# Patient Record
Sex: Male | Born: 1990 | Race: Black or African American | Hispanic: No | Marital: Married | Smoking: Never smoker
Health system: Southern US, Community
[De-identification: ages and names within clinical notes are randomized; demographics above are authoritative.]

## PROBLEM LIST (undated history)

## (undated) DIAGNOSIS — E785 Hyperlipidemia, unspecified: Secondary | ICD-10-CM

## (undated) HISTORY — DX: Hyperlipidemia, unspecified: E78.5

## (undated) HISTORY — PX: SHOULDER SURGERY: SHX246

---
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
## 2012-03-29 ENCOUNTER — Other Ambulatory Visit: Payer: Self-pay | Admitting: Orthopedic Surgery

## 2012-03-29 DIAGNOSIS — M25512 Pain in left shoulder: Secondary | ICD-10-CM

## 2012-04-02 ENCOUNTER — Ambulatory Visit
Admission: RE | Admit: 2012-04-02 | Discharge: 2012-04-02 | Disposition: A | Discharge status: Home / Self Care | Payer: PRIVATE HEALTH INSURANCE | Source: Ambulatory Visit | Attending: Orthopedic Surgery | Admitting: Orthopedic Surgery

## 2012-04-02 DIAGNOSIS — M25512 Pain in left shoulder: Secondary | ICD-10-CM

## 2012-07-05 ENCOUNTER — Emergency Department (HOSPITAL_COMMUNITY)
Admission: EM | Admit: 2012-07-05 | Discharge: 2012-07-05 | Disposition: A | Discharge status: Home / Self Care | Payer: 59 | Source: Home / Self Care | Attending: Emergency Medicine | Admitting: Emergency Medicine

## 2012-07-05 ENCOUNTER — Encounter (HOSPITAL_COMMUNITY): Payer: Self-pay | Admitting: Emergency Medicine

## 2012-07-05 DIAGNOSIS — J039 Acute tonsillitis, unspecified: Secondary | ICD-10-CM

## 2012-07-05 MED ORDER — PREDNISONE 5 MG PO KIT: 1.0000 | PACK | Freq: Every day | ORAL | Status: DC

## 2012-07-05 MED ORDER — PENICILLIN V POTASSIUM 500 MG PO TABS: 500.0000 mg | ORAL_TABLET | Freq: Four times a day (QID) | ORAL | Status: DC

## 2012-07-05 NOTE — ED Provider Notes (Signed)
Chief Complaint  Patient presents with  . Fever    History of Present Illness:   Zachary Hodges is an 21 year old male who has had a five-day history of fever of up to 103.7, chills, nasal congestion with clear rhinorrhea, headache, sore throat, abdominal pain. He has had no nausea or vomiting. He's had a slight cough productive of small amounts of yellow sputum, soreness in his neck, and feels lightheaded. He has not been exposed to strep, to the flu, or to mono and has had no personal history of strep or mono in the past.  Review of Systems:  Other than as noted above, the patient denies any of the following symptoms. Systemic:  No fever, chills, sweats, fatigue, myalgias, headache, or anorexia. Eye:  No redness, pain or drainage. ENT:  No earache, ear congestion, nasal congestion, sneezing, rhinorrhea, sinus pressure, sinus pain, or post nasal drip. Lungs:  No cough, sputum production, wheezing, shortness of breath, or chest pain. GI:  No abdominal pain, nausea, vomiting, or diarrhea. Skin:  No rash or itching.  PMFSH:  Past medical history, family history, social history, meds, allergies, and nurse's notes were reviewed.  There is no known exposure to strep or mono.  No prior history of step or mono.  The patient denies use of tobacco.  Physical Exam:   Vital signs:  BP 118/78  Pulse 97  Temp 100.9 F (38.3 C) (Oral)  Resp 18  SpO2 96% General:  Alert, in no distress. Eye:  No conjunctival injection or drainage. Lids were normal. ENT:  TMs and canals were normal, without erythema or inflammation.  Nasal mucosa was clear and uncongested, without drainage.  Mucous membranes were moist.  Exam of pharynx reveals tonsils to be enlarged, red, and with spots of white exudate.  There were no oral ulcerations or lesions. Neck:  Supple, with bilateral, tender anterior cervical adenopathy. Lungs:  No respiratory distress.  Lungs were clear to auscultation, without wheezes, rales or rhonchi.  Breath  sounds were clear and equal bilaterally.  Heart:  Regular rhythm, without gallops, murmers or rubs. Skin:  Clear, warm, and dry, without rash or lesions.  Labs:   Results for orders placed during the hospital encounter of 07/05/12  POCT RAPID STREP A ([facility] ONLY)      Component Value Range   Streptococcus, Group A Screen (Direct) NEGATIVE  NEGATIVE  POCT INFECTIOUS MONO SCREEN      Component Value Range   Mono Screen NEGATIVE  NEGATIVE    Assessment:  The encounter diagnosis was Tonsillitis.  Plan:   1.  The following meds were prescribed:   New Prescriptions   PENICILLIN V POTASSIUM (VEETID) 500 MG TABLET    Take 1 tablet (500 mg total) by mouth 4 (four) times daily.   PREDNISONE 5 MG KIT    Take 1 kit (5 mg total) by mouth daily after breakfast. Prednisone 5 mg 6 day dosepack.  Take as directed.   2.  The patient was instructed in symptomatic care including hot saline gargles, throat lozenges, infectious precautions, and need to trade out toothbrush. Handouts were given. 3.  The patient was told to return if becoming worse in any way, if no better in 3 or 4 days, and given some red flag symptoms that would indicate earlier return.    Reuben Likes, MD 07/05/12 2055

## 2012-07-05 NOTE — ED Notes (Signed)
C/o 1 week duration of fever, ST, body aches; NAD at present

## 2013-04-28 ENCOUNTER — Encounter (HOSPITAL_COMMUNITY): Payer: Self-pay | Admitting: Emergency Medicine

## 2013-04-28 ENCOUNTER — Emergency Department (INDEPENDENT_AMBULATORY_CARE_PROVIDER_SITE_OTHER)
Admission: EM | Admit: 2013-04-28 | Discharge: 2013-04-28 | Disposition: A | Discharge status: Home / Self Care | Payer: 59 | Source: Home / Self Care | Attending: Emergency Medicine | Admitting: Emergency Medicine

## 2013-04-28 DIAGNOSIS — A630 Anogenital (venereal) warts: Secondary | ICD-10-CM

## 2013-04-28 MED ORDER — RANITIDINE HCL 150 MG PO CAPS: 150.0000 mg | ORAL_CAPSULE | Freq: Every day | ORAL | Status: DC

## 2013-04-28 MED ORDER — PODOFILOX 0.5 % EX SOLN: Freq: Two times a day (BID) | CUTANEOUS | Status: DC

## 2013-04-28 NOTE — ED Provider Notes (Signed)
CSN: [id number]     Arrival date & time 04/28/13  1011 History     First MD Initiated Contact with Patient 04/28/13 1022     Chief Complaint  Patient presents with  . Migraine  . Groin Swelling   (Consider location/radiation/quality/duration/timing/severity/associated sxs/prior Treatment) HPI Comments: 22 year old male presents for evaluation of bumps on his penis for 4 months. He also has these in the area of his pubic hair. He denies any itching, pain, or discharge associated with these. He denies any contact with anybody known to have similar lesions. He has not tried to do anything to treat it is. He does not have it anywhere else on his body.  Additionally, he complains of moderately severe headache in both temples for the past week. This occurs every afternoon around 4:00 and goes away with Advil migraine. This has happened every day for the past 7 days it only lasts until he takes the Advil. He is not experiencing any headache right now. He denies any and all symptoms associated with this headache, both now and when the headache occurs. These include no dizziness, blurry vision, nausea, vomiting, skin rash, fever, chills. He states he has a history of migraines, he describes this has bad headaches that he assumes are migraines, he has never been diagnosed with migraine headaches. He admits these headaches are probably related to being dehydrated, he admits to drinking a lot of soda and no water at his job.   History reviewed. No pertinent past medical history. Past Surgical History  Procedure Laterality Date  . Shoulder surgery     History reviewed. No pertinent family history. History  Substance Use Topics  . Smoking status: Never Smoker   . Smokeless tobacco: Not on file  . Alcohol Use: Yes    Review of Systems  Constitutional: Negative for fever, chills and fatigue.  HENT: Negative for sore throat, neck pain and neck stiffness.   Eyes: Negative for visual disturbance.   Respiratory: Negative for cough and shortness of breath.   Cardiovascular: Negative for chest pain, palpitations and leg swelling.  Gastrointestinal: Negative for nausea, vomiting, abdominal pain, diarrhea and constipation.  Genitourinary: Negative for dysuria, urgency, frequency and hematuria.       Genital lesions   Musculoskeletal: Negative for myalgias and arthralgias.  Skin: Negative for rash.  Neurological: Positive for headaches. Negative for dizziness, weakness and light-headedness.    Allergies  Review of patient's allergies indicates no known allergies.  Home Medications   Current Outpatient Rx  Name  Route  Sig  Dispense  Refill  . penicillin v potassium (VEETID) 500 MG tablet   Oral   Take 1 tablet (500 mg total) by mouth 4 (four) times daily.   40 tablet   0   . podofilox (CONDYLOX) 0.5 % external solution   Topical   Apply topically 2 (two) times daily. with a cue tip or other cotton-tipped applicator for 3 days. Then no treatment for 4 days.  Repeat up to 4 times as needed   3.5 mL   1   . PredniSONE 5 MG KIT   Oral   Take 1 kit (5 mg total) by mouth daily after breakfast. Prednisone 5 mg 6 day dosepack.  Take as directed.   1 kit   0   . ranitidine (ZANTAC) 150 MG capsule   Oral   Take 1 capsule (150 mg total) by mouth daily.   30 capsule   0    BP 128/80  Pulse 61  Temp(Src) 98.2 F (36.8 C) (Oral)  Resp 16  SpO2 100% Physical Exam  Nursing note reviewed. Constitutional: He is oriented to person, place, and time. He appears well-developed and well-nourished. No distress.  HENT:  Head: Normocephalic and atraumatic.  Eyes: EOM are normal. Pupils are equal, round, and reactive to light.  Abdominal: Soft. There is no tenderness.  Genitourinary:  Discrete umbilicated papules on the shaft of the penis and in the mons pubis  Neurological: He is alert and oriented to person, place, and time. No cranial nerve deficit. Coordination normal.  Skin:  Skin is warm and dry. No rash noted.  Psychiatric: He has a normal mood and affect. Judgment normal.    ED Course   Procedures (including critical care time)  Labs Reviewed - No data to display No results found. 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 He is probably related to dehydration. Increase by mouth fluid intake and follow up with primary care if the headache does not resolve  Treat the condyloma with Condylox and oral Zantac. Follow up with derm if not improving.   Meds ordered this encounter  Medications  . podofilox (CONDYLOX) 0.5 % external solution    Sig: Apply topically 2 (two) times daily. with a cue tip or other cotton-tipped applicator for 3 days. Then no treatment for 4 days.  Repeat up to 4 times as needed    Dispense:  3.5 mL    Refill:  1  . ranitidine (ZANTAC) 150 MG capsule    Sig: Take 1 capsule (150 mg total) by mouth daily.    Dispense:  30 capsule    Refill:  0     Graylon Good, PA-C 04/28/13 1123   Graylon Good, PA-C 04/28/13 1124

## 2013-04-28 NOTE — ED Notes (Signed)
Pt c/o migraine headache for several weeks. Pt has been taking advil with no relief. Pt also voices concerns of bumps that he noticed on his genitals over 4 months ago that have not gone away. Pt has not used any otc meds for treatment.

## 2013-04-28 NOTE — ED Provider Notes (Signed)
Medical screening examination/treatment/procedure(s) were performed by non-physician practitioner and as supervising physician I was immediately available for consultation/collaboration.  Leslee Home, M.D.   Reuben Likes, MD 04/28/13 [phone]

## 2013-12-17 ENCOUNTER — Other Ambulatory Visit: Payer: Self-pay | Admitting: Physician Assistant

## 2013-12-17 ENCOUNTER — Ambulatory Visit
Admission: RE | Admit: 2013-12-17 | Discharge: 2013-12-17 | Disposition: A | Discharge status: Home / Self Care | Payer: 59 | Source: Ambulatory Visit | Attending: Physician Assistant | Admitting: Physician Assistant

## 2014-06-10 ENCOUNTER — Emergency Department (INDEPENDENT_AMBULATORY_CARE_PROVIDER_SITE_OTHER)
Admission: EM | Admit: 2014-06-10 | Discharge: 2014-06-10 | Disposition: A | Discharge status: Home / Self Care | Payer: 59 | Source: Home / Self Care | Attending: Family Medicine | Admitting: Family Medicine

## 2014-06-10 ENCOUNTER — Other Ambulatory Visit (HOSPITAL_COMMUNITY)
Admission: RE | Admit: 2014-06-10 | Discharge: 2014-06-10 | Disposition: A | Discharge status: Home / Self Care | Payer: 59 | Source: Ambulatory Visit | Attending: Family Medicine | Admitting: Family Medicine

## 2014-06-10 ENCOUNTER — Encounter (HOSPITAL_COMMUNITY): Payer: Self-pay | Admitting: Emergency Medicine

## 2014-06-10 DIAGNOSIS — Z113 Encounter for screening for infections with a predominantly sexual mode of transmission: Secondary | ICD-10-CM | POA: Insufficient documentation

## 2014-06-10 DIAGNOSIS — Z202 Contact with and (suspected) exposure to infections with a predominantly sexual mode of transmission: Secondary | ICD-10-CM

## 2014-06-10 NOTE — ED Notes (Signed)
Would like to be screened for STD Asymptomatic Sign. Other wants him to be screened since he had another sexual partner simultaneous   Alert, no signs of acute distress.

## 2014-06-10 NOTE — Discharge Instructions (Signed)
Thank you for coming in today. I will call if positive   Safe Sex Safe sex is about reducing the risk of giving or getting a sexually transmitted disease (STD). STDs are spread through sexual contact involving the genitals, mouth, or rectum. Some STDs can be cured and others cannot. Safe sex can also prevent unintended pregnancies.  WHAT ARE SOME SAFE SEX PRACTICES?  Limit your sexual activity to only one partner who is having sex with only you.  Talk to your partner about his or her past partners, past STDs, and drug use.  Use a condom every time you have sexual intercourse. This includes vaginal, oral, and anal sexual activity. Both females and males should wear condoms during oral sex. Only use latex or polyurethane condoms and water-based lubricants. Using petroleum-based lubricants or oils to lubricate a condom will weaken the condom and increase the chance that it will break. The condom should be in place from the beginning to the end of sexual activity. Wearing a condom reduces, but does not completely eliminate, your risk of getting or giving an STD. STDs can be spread by contact with infected body fluids and skin.  Get vaccinated for hepatitis B and HPV.  Avoid alcohol and recreational drugs, which can affect your judgment. You may forget to use a condom or participate in high-risk sex.  For females, avoid douching after sexual intercourse. Douching can spread an infection farther into the reproductive tract.  Check your body for signs of sores, blisters, rashes, or unusual discharge. See your health care provider if you notice any of these signs.  Avoid sexual contact if you have symptoms of an infection or are being treated for an STD. If you or your partner has herpes, avoid sexual contact when blisters are present. Use condoms at all other times.  If you are at risk of being infected with HIV, it is recommended that you take a prescription medicine daily to prevent HIV infection.  This is called pre-exposure prophylaxis (PrEP). You are considered at risk if:  You are a man who has sex with other men (MSM).  You are a heterosexual man or woman who is sexually active with more than one partner.  You take drugs by injection.  You are sexually active with a partner who has HIV.  Talk with your health care provider about whether you are at high risk of being infected with HIV. If you choose to begin PrEP, you should first be tested for HIV. You should then be tested every 3 months for as long as you are taking PrEP.  See your health care provider for regular screenings, exams, and tests for other STDs. Before having sex with a new partner, each of you should be screened for STDs and should talk about the results with each other. WHAT ARE THE BENEFITS OF SAFE SEX?   There is less chance of getting or giving an STD.  You can prevent unwanted or unintended pregnancies.  By discussing safe sex concerns with your partner, you may increase feelings of intimacy, comfort, trust, and honesty between the two of you. Document Released: 10/19/2004 Document Revised: 01/26/2014 Document Reviewed: 03/04/2012 [facility] Patient Information 2015 [place], [place]. This information is not intended to replace advice given to you by your health care provider. Make sure you discuss any questions you have with your health care provider.

## 2014-06-10 NOTE — ED Provider Notes (Signed)
Zachary Hodges is a 23 y.o. male who presents to Urgent Care today for STDs check. Patient is here today for STD testing. He is asymptomatic. He feels well otherwise.   History reviewed. No pertinent past medical history. History  Substance Use Topics  . Smoking status: Never Smoker   . Smokeless tobacco: Not on file  . Alcohol Use: Yes   ROS as above Medications: No current facility-administered medications for this encounter.   Current Outpatient Prescriptions  Medication Sig Dispense Refill  . penicillin v potassium (VEETID) 500 MG tablet Take 1 tablet (500 mg total) by mouth 4 (four) times daily.  40 tablet  0  . podofilox (CONDYLOX) 0.5 % external solution Apply topically 2 (two) times daily. with a cue tip or other cotton-tipped applicator for 3 days. Then no treatment for 4 days.  Repeat up to 4 times as needed  3.5 mL  1  . PredniSONE 5 MG KIT Take 1 kit (5 mg total) by mouth daily after breakfast. Prednisone 5 mg 6 day dosepack.  Take as directed.  1 kit  0  . ranitidine (ZANTAC) 150 MG capsule Take 1 capsule (150 mg total) by mouth daily.  30 capsule  0    Exam:  BP 128/86  Pulse 88  Temp(Src) 98.5 F (36.9 C) (Oral)  Resp 12  SpO2 98% Gen: Well NAD Genitals: No inguinal lymphadenopathy Testicles are descended bilaterally and nontender with no masses Penis is normal appearing circumcised without lesions or discharge  No results found for this or any previous visit (from the past 24 hour(s)). No results found.  Assessment and Plan: 23 y.o. male with STD check. Urine cytology for gonorrhea Chlamydia and Trichomonas, serology for HIV and syphilis are pending.  Discussed warning signs or symptoms. Please see discharge instructions. Patient expresses understanding.   This note was created using Systems analyst. Any transcription errors are unintended.    Gregor Hams, MD 06/10/14 [phone]

## 2014-06-10 NOTE — ED Notes (Signed)
Call back number verified.  

## 2014-06-11 LAB — RPR

## 2014-06-11 LAB — HIV ANTIBODY (ROUTINE TESTING W REFLEX): HIV: NONREACTIVE

## 2015-11-02 ENCOUNTER — Encounter (HOSPITAL_COMMUNITY): Payer: Self-pay | Admitting: Emergency Medicine

## 2015-11-02 ENCOUNTER — Emergency Department (HOSPITAL_COMMUNITY)
Admission: EM | Admit: 2015-11-02 | Discharge: 2015-11-02 | Disposition: A | Discharge status: Home / Self Care | Payer: 59 | Attending: Emergency Medicine | Admitting: Emergency Medicine

## 2015-11-02 DIAGNOSIS — J3489 Other specified disorders of nose and nasal sinuses: Secondary | ICD-10-CM | POA: Insufficient documentation

## 2015-11-02 DIAGNOSIS — R059 Cough, unspecified: Secondary | ICD-10-CM

## 2015-11-02 DIAGNOSIS — R05 Cough: Secondary | ICD-10-CM | POA: Insufficient documentation

## 2015-11-02 DIAGNOSIS — R0981 Nasal congestion: Secondary | ICD-10-CM | POA: Insufficient documentation

## 2015-11-02 DIAGNOSIS — J029 Acute pharyngitis, unspecified: Secondary | ICD-10-CM | POA: Diagnosis not present

## 2015-11-02 DIAGNOSIS — Z7952 Long term (current) use of systemic steroids: Secondary | ICD-10-CM | POA: Insufficient documentation

## 2015-11-02 DIAGNOSIS — R0789 Other chest pain: Secondary | ICD-10-CM | POA: Insufficient documentation

## 2015-11-02 DIAGNOSIS — Z79899 Other long term (current) drug therapy: Secondary | ICD-10-CM | POA: Diagnosis not present

## 2015-11-02 DIAGNOSIS — Z792 Long term (current) use of antibiotics: Secondary | ICD-10-CM | POA: Insufficient documentation

## 2015-11-02 DIAGNOSIS — R509 Fever, unspecified: Secondary | ICD-10-CM | POA: Insufficient documentation

## 2015-11-02 DIAGNOSIS — R0602 Shortness of breath: Secondary | ICD-10-CM | POA: Diagnosis not present

## 2015-11-02 DIAGNOSIS — M791 Myalgia: Secondary | ICD-10-CM | POA: Insufficient documentation

## 2015-11-02 MED ORDER — OXYMETAZOLINE HCL 0.05 % NA SOLN: 1.0000 | Freq: Two times a day (BID) | NASAL | Status: DC

## 2015-11-02 MED ORDER — BENZONATATE 100 MG PO CAPS: 200.0000 mg | ORAL_CAPSULE | Freq: Two times a day (BID) | ORAL | Status: DC | PRN

## 2015-11-02 MED ORDER — ACETAMINOPHEN 325 MG PO TABS
ORAL_TABLET | ORAL | Status: AC
  Filled 2015-11-02: qty 2

## 2015-11-02 MED ORDER — ACETAMINOPHEN 325 MG PO TABS
650.0000 mg | ORAL_TABLET | Freq: Once | ORAL | Status: AC | PRN
  Administered 2015-11-02: 650 mg via ORAL

## 2015-11-02 MED ORDER — OSELTAMIVIR PHOSPHATE 75 MG PO CAPS: 75.0000 mg | ORAL_CAPSULE | Freq: Two times a day (BID) | ORAL | Status: DC

## 2015-11-02 NOTE — ED Notes (Signed)
Pt sts nasal congestion, body aches and fever starting last night

## 2015-11-02 NOTE — ED Provider Notes (Signed)
CSN: [id number]     Arrival date & time 11/02/15  1233 History  By signing my name below, I, Eustaquio Maize, attest that this documentation has been prepared under the direction and in the presence of Harlene Ramus, PA-C. Electronically Signed: Eustaquio Maize, ED Scribe. 11/02/2015. 1:51 PM.   Chief Complaint  Patient presents with  . Nasal Congestion  . Chills   The history is provided by the patient. No language interpreter was used.     HPI Comments: Zachary Hodges is a 25 y.o. male who presents to the Emergency Department complaining of gradual onset, intermittent, nasal congestion and chills that began last night. Pt also complains of a subjective fever, dry cough, chest wall pain with coughing, mild shortness of breath, sinus pressure, myalgias, and sore throat. He took Tylenol and Benadryl last night which improved his symptoms but states they all came back this morning, prompting him to come to the ED. He has not taken any OTC decongestants or other medications. Recent sick contact with daughter. Denies ear pain, hemoptysis, wheezing, abdominal pain, nausea, vomiting, diarrhea, rash, or any other associated symptoms. Pt did not receive flu vaccine this year.    History reviewed. No pertinent past medical history. Past Surgical History  Procedure Laterality Date  . Shoulder surgery     History reviewed. No pertinent family history. Social History  Substance Use Topics  . Smoking status: Never Smoker   . Smokeless tobacco: None  . Alcohol Use: Yes    Review of Systems  Constitutional: Positive for fever (Subjective) and chills.  HENT: Positive for congestion, sinus pressure and sore throat. Negative for ear pain.   Respiratory: Positive for cough and shortness of breath. Negative for wheezing.   Cardiovascular: Positive for chest pain.  Gastrointestinal: Negative for nausea, vomiting, abdominal pain and diarrhea.  Musculoskeletal: Positive for myalgias.  Skin: Negative for  rash.   Allergies  Review of patient's allergies indicates no known allergies.  Home Medications   Prior to Admission medications   Medication Sig Start Date End Date Taking? Authorizing Provider  benzonatate (TESSALON) 100 MG capsule Take 2 capsules (200 mg total) by mouth 2 (two) times daily as needed for cough. 11/02/15   Nona Dell, PA-C  oseltamivir (TAMIFLU) 75 MG capsule Take 1 capsule (75 mg total) by mouth every 12 (twelve) hours. 11/02/15   Nona Dell, PA-C  oxymetazoline (AFRIN NASAL SPRAY) 0.05 % nasal spray Place 1 spray into both nostrils 2 (two) times daily. 11/02/15   Nona Dell, PA-C  penicillin v potassium (VEETID) 500 MG tablet Take 1 tablet (500 mg total) by mouth 4 (four) times daily. 07/05/12   Harden Mo, MD  podofilox (CONDYLOX) 0.5 % external solution Apply topically 2 (two) times daily. with a cue tip or other cotton-tipped applicator for 3 days. Then no treatment for 4 days.  Repeat up to 4 times as needed 04/28/13   Liam Graham, PA-C  PredniSONE 5 MG KIT Take 1 kit (5 mg total) by mouth daily after breakfast. Prednisone 5 mg 6 day dosepack.  Take as directed. 07/05/12   Harden Mo, MD  ranitidine (ZANTAC) 150 MG capsule Take 1 capsule (150 mg total) by mouth daily. 04/28/13   Freeman Caldron Baker, PA-C   BP 133/76 mmHg  Pulse 101  Temp(Src) 99.8 F (37.7 C) (Oral)  Resp 16  SpO2 100%   Physical Exam  Constitutional: He is oriented to person, place, and time. He appears  well-developed and well-nourished. No distress.  HENT:  Head: Normocephalic and atraumatic.  Right Ear: Tympanic membrane normal.  Left Ear: Tympanic membrane normal.  Nose: Rhinorrhea present. Right sinus exhibits no maxillary sinus tenderness and no frontal sinus tenderness. Left sinus exhibits no maxillary sinus tenderness and no frontal sinus tenderness.  Mouth/Throat: Uvula is midline and mucous membranes are normal. Posterior oropharyngeal erythema  present. No oropharyngeal exudate, posterior oropharyngeal edema or tonsillar abscesses.  Eyes: Conjunctivae and EOM are normal. Right eye exhibits no discharge. Left eye exhibits no discharge. No scleral icterus.  Neck: Normal range of motion. Neck supple. No tracheal deviation present.  No cervical lymphadenopathy  Cardiovascular: Normal rate, regular rhythm, normal heart sounds and intact distal pulses.   Heart rate 96 bpm  Pulmonary/Chest: Effort normal and breath sounds normal. No respiratory distress. He has no wheezes. He has no rales. He exhibits no tenderness.  Abdominal: Soft. There is no tenderness.  Musculoskeletal: Normal range of motion. He exhibits no edema.  Lymphadenopathy:    He has no cervical adenopathy.  Neurological: He is alert and oriented to person, place, and time.  Skin: Skin is warm and dry.  Psychiatric: He has a normal mood and affect. His behavior is normal.  Nursing note and vitals reviewed.   ED Course  Procedures (including critical care time)  DIAGNOSTIC STUDIES: Oxygen Saturation is 100% on RA, normal by my interpretation.    COORDINATION OF CARE: 1:45 PM-Discussed treatment plan with pt at bedside and pt agreed to plan.   Labs Review Labs Reviewed - No data to display  Imaging Review No results found.   Filed Vitals:   11/02/15 1239 11/02/15 1359  BP: 146/93 133/76  Pulse: 100 101  Temp: 100 F (37.8 C) 99.8 F (37.7 C)  Resp: 18 16     MDM   Final diagnoses:  Cough  Fever, unspecified fever cause  Nasal congestion   Patient with symptoms consistent with influenza vs. Viral URI.  Vitals are stable, initial temp in ED 100.  No signs of dehydration, tolerating PO's.  Lungs are clear. Due to patient's presentation and physical exam a chest x-ray was not ordered bc likely diagnosis of flu vs. Viral URI.  Patient will be discharged with instructions to orally hydrate, rest, and use over-the-counter medications such as  anti-inflammatories ibuprofen and Aleve for muscle aches and Tylenol for fever. Patient will also be given a cough suppressant, decongestant and Tamiflu due to sxs being present for <48 hours. Denies patient to follow up with his primary care provider in 3-4 days.  Evaluation does not show pathology requring ongoing emergent intervention or admission. Pt is hemodynamically stable and mentating appropriately. Discussed findings/results and plan with patient/guardian, who agrees with plan. All questions answered. Return precautions discussed and outpatient follow up given.    I personally performed the services described in this documentation, which was scribed in my presence. The recorded information has been reviewed and is accurate.   Chesley Noon [place], [place] 11/02/15 [place], MD 11/02/15 [phone]

## 2015-11-02 NOTE — Discharge Instructions (Signed)
Take your medications as prescribed. I also recommend using a cool mist humidifier at home for symptomatic relief. Continue drinking fluids to remain hydrated at home. Today taking Tylenol as prescribed over-the-counter for your fever and body aches. Follow-up with your primary care provider in 3-4 days. Return to the emergency department if symptoms worsen or new onset of uncontrollable fever, difficulty breathing, wheezing, chest pain, abdominal pain, vomiting, coughing up blood, rash.

## 2016-11-30 DIAGNOSIS — R070 Pain in throat: Secondary | ICD-10-CM | POA: Diagnosis not present

## 2017-01-12 ENCOUNTER — Encounter (HOSPITAL_COMMUNITY): Payer: Self-pay | Admitting: Oncology

## 2017-01-12 DIAGNOSIS — F439 Reaction to severe stress, unspecified: Secondary | ICD-10-CM | POA: Insufficient documentation

## 2017-01-12 DIAGNOSIS — R000 Tachycardia, unspecified: Secondary | ICD-10-CM | POA: Diagnosis not present

## 2017-01-12 DIAGNOSIS — Z79899 Other long term (current) drug therapy: Secondary | ICD-10-CM | POA: Insufficient documentation

## 2017-01-12 LAB — SALICYLATE LEVEL

## 2017-01-12 LAB — COMPREHENSIVE METABOLIC PANEL
ALT: 21 U/L (ref 17–63)
AST: 25 U/L (ref 15–41)
Albumin: 4.5 g/dL (ref 3.5–5.0)
Alkaline Phosphatase: 39 U/L (ref 38–126)
Anion gap: 8 (ref 5–15)
BUN: 18 mg/dL (ref 6–20)
CHLORIDE: 103 mmol/L (ref 101–111)
CO2: 28 mmol/L (ref 22–32)
CREATININE: 1.38 mg/dL — AB (ref 0.61–1.24)
Calcium: 9.5 mg/dL (ref 8.9–10.3)
GFR calc non Af Amer: >60 mL/min (ref >60)
Glucose, Bld: 113 mg/dL — ABNORMAL HIGH (ref 65–99)
POTASSIUM: 3.3 mmol/L — AB (ref 3.5–5.1)
SODIUM: 139 mmol/L (ref 135–145)
Total Bilirubin: 0.6 mg/dL (ref 0.3–1.2)
Total Protein: 7.4 g/dL (ref 6.5–8.1)

## 2017-01-12 LAB — CBC
HCT: 44.4 % (ref 39.0–52.0)
HEMOGLOBIN: 14.6 g/dL (ref 13.0–17.0)
MCH: 29.4 pg (ref 26.0–34.0)
MCHC: 32.9 g/dL (ref 30.0–36.0)
MCV: 89.3 fL (ref 78.0–100.0)
PLATELETS: 185 10*3/uL (ref 150–400)
RBC: 4.97 MIL/uL (ref 4.22–5.81)
RDW: 11.8 % (ref 11.5–15.5)
WBC: 5.5 10*3/uL (ref 4.0–10.5)

## 2017-01-12 LAB — ACETAMINOPHEN LEVEL: Acetaminophen (Tylenol), Serum: <10 ug/mL — ABNORMAL LOW (ref 10–30)

## 2017-01-12 LAB — ETHANOL: Alcohol, Ethyl (B): <5 mg/dL (ref <5)

## 2017-01-12 NOTE — ED Triage Notes (Signed)
Pt presents d/t left sided CP that does not radiate.  States pain is sharp and worse w/ inspiration.  Pt rates pain 5/10, sharp in nature.

## 2017-01-12 NOTE — ED Triage Notes (Signed)
Pt's wife asked to step out.  Pt states that he has been feeling depressed x 2 years.  Feels trapped in current relationship.  Pt states that he has passive SI and would like to be further evaluated.

## 2017-01-13 ENCOUNTER — Emergency Department (HOSPITAL_COMMUNITY)
Admission: EM | Admit: 2017-01-13 | Discharge: 2017-01-13 | Disposition: A | Discharge status: Home / Self Care | Payer: 59 | Attending: Emergency Medicine | Admitting: Emergency Medicine

## 2017-01-13 DIAGNOSIS — F439 Reaction to severe stress, unspecified: Secondary | ICD-10-CM

## 2017-01-13 MED ORDER — ACETAMINOPHEN 325 MG PO TABS
650.0000 mg | ORAL_TABLET | Freq: Once | ORAL | Status: DC
  Filled 2017-01-13: qty 2

## 2017-01-13 NOTE — ED Notes (Signed)
Pts Wife Zak Gondek [phone]. States she will come at 9 in the morning. Or if needed she can be called for questions or to be updated.

## 2017-01-13 NOTE — ED Provider Notes (Signed)
[facility]-EMERGENCY DEPT Provider Note   CSN: [id number] Arrival date & time: 01/12/17  2209     History   Chief Complaint Chief Complaint  Patient presents with  . Suicidal    HPI Zachary Hodges is a 26 y.o. male.  Patient is seen at 1:50 am. He came to the ER for evaluation of stress and feeling like he is having problems managing his life. He is in school full time, works, is married and has 2 children, the youngest of which is 19 month old. He denies substance abuse issues. He denies suicidal ideation, homicidal ideation or hallucinations. No history of SI/HI/AVH. He does not feel he is a threat to himself or his family.   The history is provided by the patient. No language interpreter was used.    History reviewed. No pertinent past medical history.  There are no active problems to display for this patient.   Past Surgical History:  Procedure Laterality Date  . SHOULDER SURGERY         Home Medications    Prior to Admission medications   Medication Sig Start Date End Date Taking? Authorizing Provider  ibuprofen (ADVIL,MOTRIN) 200 MG tablet Take 200 mg by mouth every 6 (six) hours as needed for headache, mild pain or moderate pain.   Yes Historical Provider, MD    Family History No family history on file.  Social History Social History  Substance Use Topics  . Smoking status: Never Smoker  . Smokeless tobacco: Never Used  . Alcohol use Yes     Allergies   Patient has no known allergies.   Review of Systems Review of Systems  Constitutional: Negative for chills and fever.  HENT: Negative.   Respiratory: Negative.   Cardiovascular: Negative.   Gastrointestinal: Negative.   Musculoskeletal: Negative.   Skin: Negative.   Neurological: Negative.   Psychiatric/Behavioral: Positive for dysphoric mood.     Physical Exam Updated Vital Signs BP 134/89 (BP Location: Right Arm)   Pulse 99   Temp 98.4 F (36.9 C) (Oral)   Resp 14   SpO2 100%    Physical Exam  Constitutional: He is oriented to person, place, and time. He appears well-developed and well-nourished.  Neck: Normal range of motion.  Pulmonary/Chest: Effort normal.  Musculoskeletal: Normal range of motion.  Neurological: He is alert and oriented to person, place, and time.  Skin: Skin is warm and dry.  Psychiatric: He has a normal mood and affect.     ED Treatments / Results  Labs (all labs ordered are listed, but only abnormal results are displayed) Labs Reviewed  COMPREHENSIVE METABOLIC PANEL - Abnormal; Notable for the following:       Result Value   Potassium 3.3 (*)    Glucose, Bld 113 (*)    Creatinine, Ser 1.38 (*)    All other components within normal limits  ACETAMINOPHEN LEVEL - Abnormal; Notable for the following:    Acetaminophen (Tylenol), Serum <10 (*)    All other components within normal limits  ETHANOL  SALICYLATE LEVEL  CBC  RAPID URINE DRUG SCREEN, HOSP PERFORMED    EKG  EKG Interpretation None       Radiology No results found.  Procedures Procedures (including critical care time)  Medications Ordered in ED Medications  acetaminophen (TYLENOL) tablet 650 mg (650 mg Oral Not Given 01/13/17 0050)     Initial Impression / Assessment and Plan / ED Course  I have reviewed the triage vital signs and the  nursing notes.  Pertinent labs & imaging results that were available during my care of the patient were reviewed by me and considered in my medical decision making (see chart for details).     Patient in ED for help with stress. He denies substance abuse issues. He denies suicidal ideation, homicidal ideation or hallucinations. No history of SI/HI/AVH. He does not feel he is a threat to himself or his family.  He is felt stable for discharge home as he is not felt to be at risk for self harm. Resources provided for outpatient counseling.  Final Clinical Impressions(s) / ED Diagnoses   Final diagnoses:  None   1.  Stress  New Prescriptions New Prescriptions   No medications on file     Danne Harbor 01/13/17 0158    April Palumbo, MD 01/13/17 [phone]

## 2017-01-13 NOTE — ED Notes (Signed)
SBAR Report received from previous nurse. Pt received calm and visible on unit. Pt denies current SI/ HI, A/V H, depression, anxiety, or pain at this time, and appears otherwise stable and free of distress. Pt reminded of camera surveillance, q 15 min rounds, and rules of the milieu. Will continue to assess. 

## 2017-01-13 NOTE — ED Notes (Signed)
Bed: [facility] Expected date:  Expected time:  Means of arrival:  Comments: [place]

## 2017-01-13 NOTE — ED Notes (Signed)
Orders received to discharge patient. Pt aware and discharge summery reviewed with patient as well as review of medications. All personal items return and patient signed all discharge paperwork. Pt escorted off unit.  

## 2017-01-13 NOTE — ED Notes (Signed)
EDp notified of need for tts consult and psych hold orders.

## 2017-03-16 DIAGNOSIS — Z0000 Encounter for general adult medical examination without abnormal findings: Secondary | ICD-10-CM | POA: Diagnosis not present

## 2017-03-16 DIAGNOSIS — Z23 Encounter for immunization: Secondary | ICD-10-CM | POA: Diagnosis not present

## 2017-03-16 DIAGNOSIS — Z13220 Encounter for screening for lipoid disorders: Secondary | ICD-10-CM | POA: Diagnosis not present

## 2020-07-09 ENCOUNTER — Encounter: Payer: Self-pay | Admitting: Internal Medicine

## 2020-07-09 ENCOUNTER — Ambulatory Visit (INDEPENDENT_AMBULATORY_CARE_PROVIDER_SITE_OTHER): Payer: 59 | Admitting: Internal Medicine

## 2020-07-09 ENCOUNTER — Other Ambulatory Visit: Payer: Self-pay

## 2020-07-09 VITALS — BP 155/90 | HR 106 | Temp 98.4°F | Ht 68.5 in | Wt 191.0 lb

## 2020-07-09 DIAGNOSIS — E785 Hyperlipidemia, unspecified: Secondary | ICD-10-CM | POA: Diagnosis not present

## 2020-07-09 DIAGNOSIS — Z23 Encounter for immunization: Secondary | ICD-10-CM

## 2020-07-09 DIAGNOSIS — R030 Elevated blood-pressure reading, without diagnosis of hypertension: Secondary | ICD-10-CM

## 2020-07-09 NOTE — Patient Instructions (Signed)
-  Nice seeing you today!!  -tetanus vaccine today.  -Check your blood pressure 2-3 a week at home and bring measurements into your next visit.  -Schedule follow up in 3 months for your physical. Please come in fasting that day.

## 2020-07-09 NOTE — Progress Notes (Signed)
New Patient Office Visit     This visit occurred during the SARS-CoV-2 public health emergency.  Safety protocols were in place, including screening questions prior to the visit, additional usage of staff PPE, and extensive cleaning of exam room while observing appropriate contact time as indicated for disinfecting solutions.    CC/Reason for Visit: Establish care, discuss chronic conditions Previous PCP: Johny Blamer, MD Last Visit: 2019  HPI: Zachary Hodges is a 29 y.o. male who is coming in today for the above mentioned reasons. Past Medical History is significant for: Hypertension that has not been treated.  He has been told throughout the years that he has high blood pressure but has never been diagnosed as hypertensive.  He has no acute complaints today.  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  His family history significant for a paternal grandfather with coronary artery disease.  He is overdue for flu, COVID, Tdap vaccines.   Past Medical/Surgical History: Past Medical History:  Diagnosis Date  . Hyperlipidemia     Past Surgical History:  Procedure Laterality Date  . SHOULDER SURGERY      Social History:  reports that he has never smoked. He has never used smokeless tobacco. He reports current alcohol use. He reports that he does not use drugs.  Allergies: No Known Allergies  Family History:  Family History  Problem Relation Age of Onset  . CAD Paternal Grandfather      Current Outpatient Medications:  .  ibuprofen (ADVIL,MOTRIN) 200 MG tablet, Take 200 mg by mouth every 6 (six) hours as needed for headache, mild pain or moderate pain. (Patient not taking: Reported on 07/09/2020), Disp: , Rfl:   Review of Systems:  Constitutional: Denies fever, chills,  diaphoresis, appetite change and fatigue.  HEENT: Denies photophobia, eye pain, redness, hearing loss, ear pain, congestion, sore throat, rhinorrhea, sneezing, mouth sores, trouble swallowing, neck pain, neck stiffness and tinnitus.   Respiratory: Denies SOB, DOE, cough, chest tightness,  and wheezing.   Cardiovascular: Denies chest pain, palpitations and leg swelling.  Gastrointestinal: Denies nausea, vomiting, abdominal pain, diarrhea, constipation, blood in stool and abdominal distention.  Genitourinary: Denies dysuria, urgency, frequency, hematuria, flank pain and difficulty urinating.  Endocrine: Denies: hot or cold intolerance, sweats, changes in hair or nails, polyuria, polydipsia. Musculoskeletal: Denies myalgias, back pain, joint swelling, arthralgias and gait problem.  Skin: Denies pallor, rash and wound.  Neurological: Denies dizziness, seizures, syncope, weakness, light-headedness, numbness and headaches.  Hematological: Denies adenopathy. Easy bruising, personal or family bleeding history  Psychiatric/Behavioral: Denies suicidal ideation, mood changes, confusion, nervousness, sleep disturbance and agitation    Physical Exam: Vitals:   07/09/20 1411  BP: (!) 160/90  Pulse: (!) 106  Temp: 98.4 F (36.9 C)  TempSrc: Oral  SpO2: 96%  Weight: 191 lb (86.6 kg)  Height: 5' 8.5" (1.74 m)   Body mass index is 28.62 kg/m.  Constitutional: NAD, calm, comfortable Eyes: PERRL, lids and conjunctivae normal ENMT: Mucous membranes are moist.  Respiratory: clear to auscultation bilaterally, no wheezing, no crackles. Normal respiratory effort. No accessory muscle use.  Cardiovascular: Regular rate and rhythm, no murmurs / rubs / gallops. No extremity edema. 2+ pedal pulses. No carotid bruits.  Neurologic: Grossly intact and nonfocal Psychiatric: Normal judgment and insight. Alert  and oriented x 3. Normal mood.    Impression and Plan:  Elevated BP without diagnosis of  hypertension -2 measurements in office today are elevated, he has never been diagnosed with hypertension but has been noted to have elevated measurements in the past. -He will do ambulatory blood pressure monitoring and return in 3 months for follow-up.  Hyperlipidemia, unspecified hyperlipidemia type -Last measured LDL was 168 in 2019. -He will return in 3 months fasting at which time we will recheck his lipid panel.  Need for Tdap vaccination -Tdap administered today.  He declines flu and Covid vaccines today.    Patient Instructions  -Nice seeing you today!!  -tetanus vaccine today.  -Check your blood pressure 2-3 a week at home and bring measurements into your next visit.  -Schedule follow up in 3 months for your physical. Please come in fasting that day.     Chaya Jan, MD [facility]

## 2020-07-09 NOTE — Addendum Note (Signed)
Addended by: Radford Pax M on: 07/09/2020 02:47 PM   Modules accepted: Orders

## 2020-09-10 ENCOUNTER — Other Ambulatory Visit: Payer: 59

## 2020-09-10 DIAGNOSIS — Z20822 Contact with and (suspected) exposure to covid-19: Secondary | ICD-10-CM

## 2020-09-11 LAB — SARS-COV-2, NAA 2 DAY TAT

## 2020-09-11 LAB — NOVEL CORONAVIRUS, NAA: SARS-CoV-2, NAA: NOT DETECTED

## 2020-09-14 ENCOUNTER — Telehealth (INDEPENDENT_AMBULATORY_CARE_PROVIDER_SITE_OTHER): Payer: 59 | Admitting: Family Medicine

## 2020-09-14 DIAGNOSIS — U071 COVID-19: Secondary | ICD-10-CM

## 2020-09-14 MED ORDER — BENZONATATE 100 MG PO CAPS
100.0000 mg | ORAL_CAPSULE | Freq: Three times a day (TID) | ORAL | 0 refills | Status: DC | PRN
Start: 2020-09-14 — End: 2021-01-11

## 2020-09-14 NOTE — Patient Instructions (Addendum)
  HOME CARE TIPS:   -I sent the medication(s) we discussed to your pharmacy: Meds ordered this encounter  Medications  . benzonatate (TESSALON PERLES) 100 MG capsule    Sig: Take 1 capsule (100 mg total) by mouth 3 (three) times daily as needed.    Dispense:  20 capsule    Refill:  0     -COVID19 outpatient treatment center: [phone] (only call if your Covid test is positive and you are interested in monoclonal antibody treatment which is available to those with risk factors within 10 days of symptom onset)  -can use tylenol or aleve if needed for fevers, aches and pains per instructions  -can use nasal saline a few times per day if nasal congestion, sometime a short course of Afrin nasal spray for 3 days can help as well  -stay hydrated, drink plenty of fluids and eat small healthy meals - avoid dairy  -can take 1000 IU Vit D3 and Vit C lozenges per instructions  -If the Covid test is positive, check out the CDC website for more information on home care, transmission and treatment for COVID19  -follow up with your doctor in 2-3 days unless improving and feeling better  -stay home while sick, except to seek medical care, and if you have COVID19 please stay home for a full 10 days since the onset of symptoms PLUS one day of no fever and feeling better.  It was nice to meet you today, and I really hope you are feeling better soon. I help [facility] out with telemedicine visits on Tuesdays and Thursdays and am available for visits on those days. If you have any concerns or questions following this visit please schedule a follow up visit with your Primary Care doctor or seek care at a local urgent care clinic to avoid delays in care.    Seek in person care promptly if your symptoms worsen, new concerns arise or you are not improving with treatment. Call 911 and/or seek emergency care if you symptoms are severe or life threatening.   

## 2020-09-14 NOTE — Progress Notes (Signed)
Virtual Visit via Video Note  I connected with Bosten  on 09/14/20 at  4:00 PM EST by a video enabled telemedicine application and verified that I am speaking with the correct person using two identifiers.  Location patient: home, [place] Location provider:work or home office Persons participating in the virtual visit: patient, provider  I discussed the limitations of evaluation and management by telemedicine and the availability of in person appointments. The patient expressed understanding and agreed to proceed.   HPI:  Acute telemedicine visit for COVID19: -Onset: 5 days ago; had negative test the first day - then positive test 2 days ago -Symptoms include: fevers and body aches, some sob  initially, cough, sinus congestion, HAs, fatigue, loss of taste -fever has resolved now, SOB has resolved -Denies: NVD, CP, SOB, inability to eat/drink or get out of bed  -Has tried: tylenol, dayquil, nyquil -Pertinent past medical history: overweight  -COVID-19 vaccine status: not vaccinated  ROS: See pertinent positives and negatives per HPI.  Past Medical History:  Diagnosis Date  . Hyperlipidemia     Past Surgical History:  Procedure Laterality Date  . SHOULDER SURGERY       Current Outpatient Medications:  .  benzonatate (TESSALON PERLES) 100 MG capsule, Take 1 capsule (100 mg total) by mouth 3 (three) times daily as needed., Disp: 20 capsule, Rfl: 0 .  ibuprofen (ADVIL,MOTRIN) 200 MG tablet, Take 200 mg by mouth every 6 (six) hours as needed for headache, mild pain or moderate pain. (Patient not taking: Reported on 07/09/2020), Disp: , Rfl:   EXAM:  VITALS per patient if applicable:  GENERAL: alert, oriented, appears well and in no acute distress  HEENT: atraumatic, conjunttiva clear, no obvious abnormalities on inspection of external nose and ears  NECK: normal movements of the head and neck  LUNGS: on inspection no signs of respiratory distress, breathing rate appears normal,  no obvious gross SOB, gasping or wheezing  CV: no obvious cyanosis  MS: moves all visible extremities without noticeable abnormality  PSYCH/NEURO: pleasant and cooperative, no obvious depression or anxiety, speech and thought processing grossly intact  ASSESSMENT AND PLAN:  Discussed the following assessment and plan:  COVID-19  -we discussed possible serious and likely etiologies, options for evaluation and workup, limitations of telemedicine visit vs in person visit, treatment, treatment risks and precautions. Pt prefers to treat via telemedicine empirically rather than in person at this moment. Discussed treatment options, symptomatic care, potential complications, isolation and precautions for covid illness. Patient is interested in [facility]. Sent message to covid treatment center regarding patient and provided him with contact info as well. Sent Tessalon rx for cough. Other otc symptomatic care options summarized in pt instructions. Work/School slipped offered:  declined Scheduled follow up with PCP offered: agrees to follow up if needed Advised to seek prompt in person care if worsening, new symptoms arise, or if is not improving with treatment. Discussed options for inperson care if PCP office not available. Did let this patient know that I only do telemedicine on Tuesdays and Thursdays for [place]. Advised to schedule follow up visit with PCP or [facility] if any further questions or concerns to avoid delays in care.   I discussed the assessment and treatment plan with the patient. The patient was provided an opportunity to ask questions and all were answered. The patient agreed with the plan and demonstrated an understanding of the instructions.     Terressa Koyanagi, DO

## 2020-10-19 ENCOUNTER — Encounter: Payer: 59 | Admitting: Internal Medicine

## 2020-10-19 DIAGNOSIS — Z0289 Encounter for other administrative examinations: Secondary | ICD-10-CM

## 2021-01-11 ENCOUNTER — Encounter: Payer: Self-pay | Admitting: Internal Medicine

## 2021-01-11 ENCOUNTER — Ambulatory Visit (INDEPENDENT_AMBULATORY_CARE_PROVIDER_SITE_OTHER): Payer: 59 | Admitting: Internal Medicine

## 2021-01-11 ENCOUNTER — Other Ambulatory Visit: Payer: Self-pay

## 2021-01-11 VITALS — BP 130/100 | HR 52 | Temp 98.6°F | Wt 186.6 lb

## 2021-01-11 DIAGNOSIS — L03012 Cellulitis of left finger: Secondary | ICD-10-CM

## 2021-01-11 NOTE — Progress Notes (Signed)
Established Patient Office Visit     This visit occurred during the SARS-CoV-2 public health emergency.  Safety protocols were in place, including screening questions prior to the visit, additional usage of staff PPE, and extensive cleaning of exam room while observing appropriate contact time as indicated for disinfecting solutions.    CC/Reason for Visit: Infected index finger of left hand  HPI: Zachary Hodges is a 30 y.o. male who is coming in today for the above mentioned reasons.  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  He started noticing the swelling about 3 weeks ago.  Last week it got worse so he went to urgent care.  He tells me that they tried to incise and drain it, however no purulent material was expressible.  He made this appointment today because pain and swelling was getting worse.  There is obvious fluctuance and edema today.  Past Medical/Surgical History: Past Medical History:  Diagnosis Date  . Hyperlipidemia     Past Surgical History:  Procedure Laterality Date  . SHOULDER SURGERY      Social History:  reports that he has never smoked. He has never used smokeless tobacco. He reports current alcohol use. He reports that he does not use drugs.  Allergies: No Known Allergies  Family History:  Family History  Problem Relation Age of Onset  . CAD Paternal Grandfather      Current Outpatient Medications:  .  ibuprofen (ADVIL,MOTRIN) 200 MG tablet, Take 200 mg by mouth every 6 (six) hours as needed for headache, mild pain or moderate pain., Disp: , Rfl:  .  cephALEXin (KEFLEX) 500 MG capsule, Take 500 mg by mouth 3 (three) times daily., Disp: , Rfl:   Review of Systems:  Constitutional: Denies fever, chills, diaphoresis, appetite change and fatigue.  HEENT: Denies photophobia, eye pain, redness, hearing loss, ear pain, congestion, sore throat, rhinorrhea,  sneezing, mouth sores, trouble swallowing, neck pain, neck stiffness and tinnitus.   Respiratory: Denies SOB, DOE, cough, chest tightness,  and wheezing.   Cardiovascular: Denies chest pain, palpitations and leg swelling.  Gastrointestinal: Denies nausea, vomiting, abdominal pain, diarrhea, constipation, blood in stool and abdominal distention.  Genitourinary: Denies dysuria, urgency, frequency, hematuria, flank pain and difficulty urinating.  Endocrine: Denies: hot or cold intolerance, sweats, changes in hair or nails, polyuria, polydipsia. Musculoskeletal: Denies myalgias, back pain, joint swelling, arthralgias and gait problem.  Skin: Denies pallor, rash  Neurological: Denies dizziness, seizures, syncope, weakness, light-headedness, numbness and headaches.  Hematological: Denies adenopathy. Easy bruising, personal or family bleeding history  Psychiatric/Behavioral: Denies suicidal ideation, mood changes, confusion, nervousness, sleep disturbance and agitation    Physical Exam: Vitals:   01/11/21 0834  BP: (!) 130/100  Pulse: (!) 52  Temp: 98.6 F (37 C)  TempSrc: Oral  SpO2: 95%  Weight: 186 lb 9.6 oz (84.6 kg)    Body mass index is 27.96 kg/m.   Constitutional: NAD, calm, comfortable Left index finger is edematous and erythematous with obvious fluctuance.   Impression and Plan:  Paronychia of finger of left hand  Procedure:  Incision and drainage of abscess Risks, benefits, and alternatives explained and consent obtained. Time out conducted. Surface cleaned with alcohol. Area prepped and draped in a sterile fashion. #11 blade used to make a stab incision into abscess. Pus expressed with pressure. Curved hemostat used to explore 4 quadrants and loculations  broken up. Further purulence expressed. No packing was left in place. Hemostasis achieved. Pt stable. Finger has been wrapped in stretchy bandage and Coban. Aftercare and follow-up advised.  -He was  prescribed Keflex 500 mg 3 times a day for 10 days. -He has been advised to contact me if no improvement in that timeframe.    Chaya Jan, MD [facility]

## 2021-09-08 ENCOUNTER — Telehealth: Payer: Self-pay | Admitting: Internal Medicine

## 2021-09-08 NOTE — Telephone Encounter (Signed)
Tried to call patient to schedule annual physical with Dr. Ardyth Harps.  No answer and could not leave voicemail message.

## 2023-04-18 ENCOUNTER — Emergency Department (HOSPITAL_BASED_OUTPATIENT_CLINIC_OR_DEPARTMENT_OTHER): Payer: BC Managed Care – PPO | Admitting: Radiology

## 2023-04-18 ENCOUNTER — Encounter (HOSPITAL_BASED_OUTPATIENT_CLINIC_OR_DEPARTMENT_OTHER): Payer: Self-pay

## 2023-04-18 ENCOUNTER — Emergency Department (HOSPITAL_BASED_OUTPATIENT_CLINIC_OR_DEPARTMENT_OTHER)
Admission: EM | Admit: 2023-04-18 | Discharge: 2023-04-18 | Disposition: A | Discharge status: Home / Self Care | Payer: BC Managed Care – PPO | Source: Home / Self Care | Attending: Emergency Medicine | Admitting: Emergency Medicine

## 2023-04-18 ENCOUNTER — Other Ambulatory Visit: Payer: Self-pay

## 2023-04-18 DIAGNOSIS — L03116 Cellulitis of left lower limb: Secondary | ICD-10-CM | POA: Insufficient documentation

## 2023-04-18 DIAGNOSIS — M7989 Other specified soft tissue disorders: Secondary | ICD-10-CM | POA: Diagnosis present

## 2023-04-18 DIAGNOSIS — L03119 Cellulitis of unspecified part of limb: Secondary | ICD-10-CM

## 2023-04-18 MED ORDER — CEPHALEXIN 500 MG PO CAPS: 500.0000 mg | ORAL_CAPSULE | Freq: Two times a day (BID) | ORAL | 0 refills | Status: AC

## 2023-04-18 MED ORDER — CEPHALEXIN 250 MG PO CAPS
500.0000 mg | ORAL_CAPSULE | Freq: Once | ORAL | Status: AC
  Administered 2023-04-18: 500 mg via ORAL
  Filled 2023-04-18: qty 2

## 2023-04-18 NOTE — ED Triage Notes (Signed)
Complains of left foot swelling, redness, noted wound in between pinky toe and 4th toe with pus.

## 2023-04-18 NOTE — ED Provider Notes (Signed)
[place] EMERGENCY DEPARTMENT AT [facility] Provider Note   CSN: [id number] Arrival date & time: 04/18/23  8127     History  Chief Complaint  Patient presents with   Foot Pain    Zachary Hodges is a 32 y.o. male here with concern for redness and swelling on his distal left foot.  He noticed this over the past day.  Patient reports that he wears steel toed boots at work.  He denies history of diabetes or prior foot infections.  HPI     Home Medications Prior to Admission medications   Medication Sig Start Date End Date Taking? Authorizing Provider  cephALEXin (KEFLEX) 500 MG capsule Take 1 capsule (500 mg total) by mouth 2 (two) times daily for 7 days. 04/18/23 04/25/23 Yes Oleda Borski, Kermit Balo, MD  cephALEXin (KEFLEX) 500 MG capsule Take 500 mg by mouth 3 (three) times daily. 01/09/21   [provider]  ibuprofen (ADVIL,MOTRIN) 200 MG tablet Take 200 mg by mouth every 6 (six) hours as needed for headache, mild pain or moderate pain.    [provider]      Allergies    Patient has no known allergies.    Review of Systems   Review of Systems  Physical Exam Updated Vital Signs BP (!) 139/90 (BP Location: Right Arm)   Pulse 89   Temp 98.1 F (36.7 C) (Oral)   Resp 16   Ht 5' 8.5" (1.74 m)   Wt 84.4 kg   SpO2 97%   BMI 27.87 kg/m  Physical Exam Constitutional:      General: He is not in acute distress. HENT:     Head: Normocephalic and atraumatic.  Eyes:     Conjunctiva/sclera: Conjunctivae normal.     Pupils: Pupils are equal, round, and reactive to light.  Cardiovascular:     Rate and Rhythm: Normal rate and regular rhythm.  Pulmonary:     Effort: Pulmonary effort is normal. No respiratory distress.  Abdominal:     General: There is no distension.     Tenderness: There is no abdominal tenderness.  Musculoskeletal:     Comments: Erythema on the dorsal surface of the left foot with some mild underlying edema, no crepitus, no  purulent discharge.  There is a small ulceration between the fourth and the fifth toe without drainage  Skin:    General: Skin is warm and dry.  Neurological:     General: No focal deficit present.     Mental Status: He is alert. Mental status is at baseline.  Psychiatric:        Mood and Affect: Mood normal.        Behavior: Behavior normal.     ED Results / Procedures / Treatments   Labs (all labs ordered are listed, but only abnormal results are displayed) Labs Reviewed - No data to display  EKG None  Radiology DG Foot Complete Left  Result Date: 04/18/2023 CLINICAL DATA:  Swelling and pain. EXAM: LEFT FOOT - COMPLETE 3+ VIEW COMPARISON:  None Available. FINDINGS: There is no evidence of fracture or dislocation. There is no evidence of arthropathy or other focal bone abnormality. There is mild soft tissue swelling. IMPRESSION: Soft tissue swelling without acute underlying bone abnormality. Electronically Signed   By: Almira Bar M.D.   On: 04/18/2023 07:44    Procedures Procedures    Medications Ordered in ED Medications  cephALEXin (KEFLEX) capsule 500 mg (has no administration in time range)  ED Course/ Medical Decision Making/ A&P                             Medical Decision Making Amount and/or Complexity of Data Reviewed Radiology: ordered.  Risk Prescription drug management.   Patient is here with concern for cellulitis or foot infection, possibly related to small amount of skin breakdown between the fourth and fifth toe.  We will start him on cephalexin at this time.  He does not have signs of sepsis.  I do not see indication for IV antibiotics or hospitalization.  X-rays ordered from triage and personally viewed interpreted, with no underlying fracture or bony lesions, and noted tissue edema.  Return precautions were discussed. I advised the patient try to keep his foot as dry as possible.  He verbalized understanding        Final Clinical  Impression(s) / ED Diagnoses Final diagnoses:  Cellulitis of foot    Rx / DC Orders ED Discharge Orders          Ordered    cephALEXin (KEFLEX) 500 MG capsule  2 times daily        04/18/23 0753              Terald Sleeper, MD 04/18/23 [id number]
